# Patient Record
Sex: Female | Born: 1995 | Race: Black or African American | Hispanic: No | Marital: Single | Smoking: Never smoker
Health system: Southern US, Community
[De-identification: ages and names within clinical notes are randomized; demographics above are authoritative.]

---

## 2015-10-04 ENCOUNTER — Emergency Department (INDEPENDENT_AMBULATORY_CARE_PROVIDER_SITE_OTHER)
Admission: EM | Admit: 2015-10-04 | Discharge: 2015-10-04 | Disposition: A | Discharge status: Home / Self Care | Payer: Medicaid Other | Source: Home / Self Care | Attending: Family Medicine | Admitting: Family Medicine

## 2015-10-04 ENCOUNTER — Encounter (HOSPITAL_COMMUNITY): Payer: Self-pay | Admitting: Emergency Medicine

## 2015-10-04 ENCOUNTER — Emergency Department (INDEPENDENT_AMBULATORY_CARE_PROVIDER_SITE_OTHER): Payer: Medicaid Other

## 2015-10-04 DIAGNOSIS — M25562 Pain in left knee: Secondary | ICD-10-CM

## 2015-10-04 MED ORDER — IBUPROFEN 800 MG PO TABS
ORAL_TABLET | ORAL | Status: AC
  Filled 2015-10-04: qty 1

## 2015-10-04 MED ORDER — NAPROXEN 500 MG PO TABS: 500.0000 mg | ORAL_TABLET | Freq: Two times a day (BID) | ORAL | Status: DC

## 2015-10-04 MED ORDER — IBUPROFEN 800 MG PO TABS
800.0000 mg | ORAL_TABLET | Freq: Once | ORAL | Status: AC
  Administered 2015-10-04: 800 mg via ORAL
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
## 2015-10-04 NOTE — Discharge Instructions (Signed)
It is a pleasure to see you today for the pain in your left knee.   The x-ray in the Urgent Care Center does not show fractures or other bony injuries.   You are being given a dose of IBUPROFEN 800mg  to take by mouth in the Urgent Care Center.   Starting tomorrow, you may use the prescription NAPROXEN 500mg , take 1 tablet by mouth with breakfast and with supper, if you have knee pain.   Ice to the left knee frequently through the day; a note for work is being given.

## 2015-12-03 ENCOUNTER — Encounter (HOSPITAL_COMMUNITY): Payer: Self-pay

## 2015-12-03 ENCOUNTER — Emergency Department (HOSPITAL_COMMUNITY)
Admission: EM | Admit: 2015-12-03 | Discharge: 2015-12-03 | Disposition: A | Discharge status: Home / Self Care | Payer: Medicaid Other | Attending: Emergency Medicine | Admitting: Emergency Medicine

## 2015-12-03 DIAGNOSIS — Z791 Long term (current) use of non-steroidal anti-inflammatories (NSAID): Secondary | ICD-10-CM | POA: Diagnosis not present

## 2015-12-03 DIAGNOSIS — H9203 Otalgia, bilateral: Secondary | ICD-10-CM | POA: Diagnosis present

## 2015-12-03 DIAGNOSIS — H6693 Otitis media, unspecified, bilateral: Secondary | ICD-10-CM | POA: Diagnosis not present

## 2015-12-03 DIAGNOSIS — Z792 Long term (current) use of antibiotics: Secondary | ICD-10-CM | POA: Insufficient documentation

## 2015-12-03 MED ORDER — AMOXICILLIN 500 MG PO CAPS: 500.0000 mg | ORAL_CAPSULE | Freq: Two times a day (BID) | ORAL | Status: DC

## 2015-12-03 MED ORDER — OFLOXACIN 0.3 % OT SOLN: 5.0000 [drp] | Freq: Two times a day (BID) | OTIC | Status: DC

## 2015-12-03 MED ORDER — IBUPROFEN 400 MG PO TABS
600.0000 mg | ORAL_TABLET | Freq: Once | ORAL | Status: AC
  Administered 2015-12-03: 600 mg via ORAL
  Filled 2015-12-03: qty 1

## 2015-12-03 MED ORDER — AMOXICILLIN 500 MG PO CAPS
500.0000 mg | ORAL_CAPSULE | Freq: Once | ORAL | Status: AC
  Administered 2015-12-03: 500 mg via ORAL
  Filled 2015-12-03: qty 1
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
## 2015-12-03 NOTE — ED Provider Notes (Signed)
CSN: [id number]     Arrival date & time 12/03/15  1119 History   First MD Initiated Contact with Patient 12/03/15 1224     Chief Complaint  Patient presents with  . Otalgia     (Consider location/radiation/quality/duration/timing/severity/associated sxs/prior Treatment) HPI   Kirsten Mack is a 20 year old female with no significant past medical history presents the ED today complaining of bilateral ear pain onset 3 days ago. Patient states that it initially started her right ear but now is hurting in her left ear as well. She states she feels like her ears are swollen. She has not tried taking anything for her symptoms. She denies fevers, chills, mastoid tenderness, neck pain or stiffness, sore throat, cough, rhinorrhea.   History reviewed. No pertinent past medical history. History reviewed. No pertinent past surgical history. No family history on file. Social History  Substance Use Topics  . Smoking status: Never Smoker   . Smokeless tobacco: None  . Alcohol Use: No   OB History    No data available     Review of Systems  All other systems reviewed and are negative.     Allergies  Review of patient's allergies indicates no known allergies.  Home Medications   Prior to Admission medications   Medication Sig Start Date End Date Taking? Authorizing Provider  amoxicillin (AMOXIL) 500 MG capsule Take 1 capsule (500 mg total) by mouth 2 (two) times daily. 12/03/15   Passion Lavin Tripp Yarielys Beed, PA-C  naproxen (NAPROSYN) 500 MG tablet Take 1 tablet (500 mg total) by mouth 2 (two) times daily with a meal. 10/04/15   Barbaraann Barthel, MD  ofloxacin (FLOXIN) 0.3 % otic solution Place 5 drops into both ears 2 (two) times daily. 12/03/15   Kassadee Carawan Tripp Areesha Dehaven, PA-C   BP 126/85 mmHg  Pulse 65  Temp(Src) 97.9 F (36.6 C) (Oral)  Resp 14  SpO2 100% Physical Exam  Constitutional: She is oriented to person, place, and time. She appears well-developed and well-nourished. No distress.   HENT:  Head: Normocephalic and atraumatic.  Right Ear: There is swelling. No mastoid tenderness. Tympanic membrane is erythematous.  Left Ear: There is swelling. No mastoid tenderness. Tympanic membrane is erythematous.  Eyes: Conjunctivae and EOM are normal. Pupils are equal, round, and reactive to light. Right eye exhibits no discharge. Left eye exhibits no discharge. No scleral icterus.  Cardiovascular: Normal rate.   Pulmonary/Chest: Effort normal.  Neurological: She is alert and oriented to person, place, and time. Coordination normal.  Skin: Skin is warm and dry. No rash noted. She is not diaphoretic. No erythema. No pallor.  Psychiatric: She has a normal mood and affect. Her behavior is normal.  Nursing note and vitals reviewed.   ED Course  Procedures (including critical care time) Labs Review Labs Reviewed - No data to display  Imaging Review No results found. I have personally reviewed and evaluated these images and lab results as part of my medical decision-making.   EKG Interpretation None      MDM   Final diagnoses:  Bilateral otitis media, recurrence not specified, unspecified chronicity, unspecified otitis media type    Patient presents with otalgia and exam consistent with acute otitis media. No concern for acute mastoiditis, meningitis. Patient also has swelling of both external ear canals, will also treat with antibiotic drops for possible otitis externa as well.  No antibiotic use in the last month.  Patient discharged home with Amoxicillin.   Advised PCP follow-up.  I have also  discussed reasons to return immediately to the ER.  Parent expresses understanding and agrees with plan.       Lester KinsmanSamantha Tripp [place], PA-C 12/03/15 1300  Glynn OctaveStephen Rancour, MD 12/03/15 [phone]

## 2015-12-03 NOTE — ED Notes (Signed)
Patient here with bilateral ear pain x 3 days. No other associated sympoms

## 2015-12-03 NOTE — ED Notes (Signed)
Declined W/C at D/C and was escorted to lobby by RN. 

## 2017-02-08 ENCOUNTER — Ambulatory Visit (HOSPITAL_COMMUNITY)
Admission: EM | Admit: 2017-02-08 | Discharge: 2017-02-08 | Disposition: A | Discharge status: Home / Self Care | Payer: Medicaid Other | Attending: Emergency Medicine | Admitting: Emergency Medicine

## 2017-02-08 ENCOUNTER — Encounter (HOSPITAL_COMMUNITY): Payer: Self-pay | Admitting: *Deleted

## 2017-02-08 DIAGNOSIS — N3090 Cystitis, unspecified without hematuria: Secondary | ICD-10-CM

## 2017-02-08 DIAGNOSIS — Z3202 Encounter for pregnancy test, result negative: Secondary | ICD-10-CM

## 2017-02-08 DIAGNOSIS — N898 Other specified noninflammatory disorders of vagina: Secondary | ICD-10-CM | POA: Diagnosis not present

## 2017-02-08 DIAGNOSIS — R109 Unspecified abdominal pain: Secondary | ICD-10-CM | POA: Diagnosis present

## 2017-02-08 LAB — POCT PREGNANCY, URINE: Preg Test, Ur: NEGATIVE

## 2017-02-08 MED ORDER — NITROFURANTOIN MONOHYD MACRO 100 MG PO CAPS: 100.0000 mg | ORAL_CAPSULE | Freq: Two times a day (BID) | ORAL | 0 refills | Status: DC

## 2017-02-08 MED ORDER — PHENAZOPYRIDINE HCL 200 MG PO TABS: 200.0000 mg | ORAL_TABLET | Freq: Three times a day (TID) | ORAL | 0 refills | Status: AC

## 2017-02-08 MED ORDER — METRONIDAZOLE 0.75 % VA GEL: 1.0000 | Freq: Two times a day (BID) | VAGINAL | 0 refills | Status: AC

## 2017-02-08 NOTE — ED Provider Notes (Signed)
CSN: [id number]     Arrival date & time 02/08/17  1150 History   None    Chief Complaint  Patient presents with  . Abdominal Pain   (Consider location/radiation/quality/duration/timing/severity/associated sxs/prior Treatment) 21 year old female comes in for 2 day history of painful urination. History of present illness was given by patient through translator. She also has vaginal discharge. Patient denies abdominal pain, nausea, vomiting. Denies fever, chills, night sweats. Denies frequency, hematuria, back pain. Patient states she has vaginal discharge with small bumps around her labia majora. She is currently not sexually active, last menstrual cycle 01/22/2017, without problems. She does shave, small bumps are not painful to palpation. Denies surrounding erythema, increased warmth.      History reviewed. No pertinent past medical history. History reviewed. No pertinent surgical history. History reviewed. No pertinent family history. Social History  Substance Use Topics  . Smoking status: Never Smoker  . Smokeless tobacco: Never Used  . Alcohol use No   OB History    No data available     Review of Systems  Reason unable to perform ROS: See HPI as above.    Allergies  Patient has no known allergies.  Home Medications   Prior to Admission medications   Medication Sig Start Date End Date Taking? Authorizing Provider  metroNIDAZOLE (METROGEL) 0.75 % vaginal gel Place 1 Applicatorful vaginally 2 (two) times daily. 02/08/17 02/13/17  Belinda FisherYu, Amy V, PA-C  nitrofurantoin, macrocrystal-monohydrate, (MACROBID) 100 MG capsule Take 1 capsule (100 mg total) by mouth 2 (two) times daily. 02/08/17   Cathie HoopsYu, Amy V, PA-C  phenazopyridine (PYRIDIUM) 200 MG tablet Take 1 tablet (200 mg total) by mouth 3 (three) times daily. 02/08/17   Belinda FisherYu, Amy V, PA-C   Meds Ordered and Administered this Visit  Medications - No data to display  BP 135/77 (BP Location: Right Arm)   Pulse 74   Temp 98.5 F (36.9 C)  (Oral)   Resp 16   LMP 01/22/2017   SpO2 100%  No data found.   Physical Exam  Constitutional: She is oriented to person, place, and time. She appears well-developed and well-nourished. No distress.  HENT:  Head: Normocephalic and atraumatic.  Eyes: Pupils are equal, round, and reactive to light. Conjunctivae are normal.  Cardiovascular: Normal rate, regular rhythm and normal heart sounds.  Exam reveals no gallop and no friction rub.   No murmur heard. Pulmonary/Chest: Effort normal and breath sounds normal. She has no wheezes. She has no rales.  Abdominal: Soft. Bowel sounds are normal. She exhibits no mass. There is no tenderness. There is no rebound and no guarding.  Genitourinary: Uterus normal. Cervix exhibits discharge. Cervix exhibits no motion tenderness and no friability. Right adnexum displays no mass and no tenderness. Left adnexum displays no mass and no tenderness. No erythema in the vagina. Vaginal discharge found.  Genitourinary Comments: Multiple round papular lesions around bilateral labia majora. Not painful to palpation, no increased warmth, surrounding erythema.   Neurological: She is alert and oriented to person, place, and time.  Skin: Skin is warm and dry.  Psychiatric: She has a normal mood and affect. Her behavior is normal. Judgment normal.    Urgent Care Course   Clinical Course as of Feb 08 1406  Fri Feb 08, 2017  1403 Results of dipstick read to me, large leukocytes, positive nitrites, large Hgb.   [AY]    Clinical Course User Index [AY] Belinda FisherYu, Amy V, PA-C    Procedures (including critical care time)  Labs Review Labs Reviewed  URINE CULTURE  POCT PREGNANCY, URINE  CERVICOVAGINAL ANCILLARY ONLY    Imaging Review No results found.         MDM   1. Cystitis   2. Vaginal discharge    Discussed lab results with patient, dipstick positive for UTI, will start Macrobid. Given patient with extreme pain on urination, pyridium sent in. Vaginal  discharge most consistent with bacterial vaginosis, will start empiric treatment with metrogel given patient is on other medications that could cause interactions. Urine culture and cytology sent, patient will be contacted with any positive results that requires further treatment. Monitor for worsening of symptoms, abdominal pain, fever, follow-up for reevaluation.   Belinda FisherYu, Amy V, PA-C 02/08/17 1407

## 2017-02-08 NOTE — Discharge Instructions (Signed)
Your urine was positive for UTI, take Macrobid as directed for 5 days. Pyridium for painful urination as directed. We are treating you for bacterial vaginosis, use MetroGel as directed vaginally. Testing is sent, you will be contacted for any additional treatment needed. Monitor for worsening of symptoms, abdominal pain, fever, follow-up at the emergency department for further evaluation.

## 2017-02-08 NOTE — ED Triage Notes (Addendum)
Pt  Reports    Low   abd   Pain       With     painfull      Urination    And  Some  Bleeding  When  She  Urinates       Symptoms   X   2          Days  [employer]  Utilized  Also  Has  A  Growth  Or  A   Wart  X  2  Weeks  In  Vaginal  Area

## 2017-02-08 NOTE — ED Notes (Signed)
Urine specimen obtained, urinalysis run, values of the urine exceeded the limits of the clinitex test. Reported results to PA Amy Yu.

## 2017-02-10 ENCOUNTER — Telehealth (HOSPITAL_COMMUNITY): Payer: Self-pay | Admitting: Internal Medicine

## 2017-02-10 MED ORDER — CEPHALEXIN 500 MG PO CAPS: 500.0000 mg | ORAL_CAPSULE | Freq: Two times a day (BID) | ORAL | 0 refills | Status: AC

## 2017-02-10 NOTE — Telephone Encounter (Signed)
Clinical staff, please let patient know that urine culture was positive for Proteus germ, resistant to nitrofurantoin rx given at the urgent care visit 8/3 but sensitive to cephalexin.  Rx cephal[facility] sent [facility] pharmacy of [facility] [place].  Stop nitrofurantoin and start cephalexin.   Other test results still pending.  Recheck for further evaluation if symptoms are not improving.  LM

## 2017-02-11 LAB — CERVICOVAGINAL ANCILLARY ONLY
BACTERIAL VAGINITIS: POSITIVE — AB
Candida vaginitis: POSITIVE — AB
Chlamydia: POSITIVE — AB
NEISSERIA GONORRHEA: NEGATIVE
TRICH (WINDOWPATH): NEGATIVE

## 2017-02-12 ENCOUNTER — Telehealth (HOSPITAL_COMMUNITY): Payer: Self-pay | Admitting: *Deleted

## 2017-02-12 MED ORDER — FLUCONAZOLE 150 MG PO TABS: 150.0000 mg | ORAL_TABLET | Freq: Once | ORAL | 0 refills | Status: AC

## 2017-02-12 MED ORDER — AZITHROMYCIN 250 MG PO TABS: 1000.0000 mg | ORAL_TABLET | Freq: Once | ORAL | 0 refills | Status: AC

## 2017-02-12 NOTE — Telephone Encounter (Signed)
[facility] Called and spoke to patient, patients second language is english. Medications sent to pharmacy.

## 2017-02-13 LAB — URINE CULTURE: Culture: >=100000 — AB

## 2017-03-15 ENCOUNTER — Telehealth: Payer: Self-pay

## 2017-03-15 NOTE — Telephone Encounter (Signed)
Client  stopped by requesting assistance calling [place] [facility]. Same done. Patient has no health concerns at this time  Arman BogusDorothy Ameka Krigbaum RN  BSN PCCN CNP [phone]

## 2017-04-12 NOTE — Congregational Nurse Program (Signed)
Congregational Nurse Program Note  Date of Encounter: 04/12/2017  Past Medical History: No past medical history on file.  Encounter Details:     CNP Questionnaire - 04/12/17 1300      Patient Demographics   Is this a new or existing patient? Existing   Patient is considered a/an Refugee   Race African     Patient Assistance   Location of Patient Engineer, [place]   Patient's financial/insurance status Self-Pay (Uninsured)   Uninsured Patient ([employer] officer, trade union) Yes   Interventions Not Applicable   Patient referred to apply for the following financial assistance [employer] insecurities addressed Not Technical brewer No   Assistance securing medications No   Product/process development scientist the healthcare system     Encounter Details   Primary purpose of visit Other  assisted her to read a letter from pharmacy   Was an Emergency Department visit averted? Not Applicable   Does patient have a medical provider? Yes   Patient referred to Not Applicable   Was a mental health screening completed? (GAINS tool) No   Does patient have dental issues? No   Does patient have vision issues? No   Does your patient have an abnormal blood pressure today? No   Since previous encounter, have you referred patient for abnormal blood pressure that resulted in a new diagnosis or medication change? No   Does your patient have an abnormal blood glucose today? No   Since previous encounter, have you referred patient for abnormal blood glucose that resulted in a new diagnosis or medication change? No   Was there a life-saving intervention made? No    Client stopped by the office requesting help to read and understanding a letter from [facility] pharmacy. Same done. No other concerns. Nicole Cella Marna Weniger RN BSN PCCN CNP [phone]  [phone]

## 2017-08-05 NOTE — Congregational Nurse Program (Signed)
Congregational Nurse Program Note  Date of Encounter: 08/05/2017  Past Medical History: No past medical history on file.  Encounter Details: CNP Questionnaire - 08/05/17 1250      Questionnaire   Patient Status  Refugee    Race  African    Location Patient Served At  Not Applicable    Insurance  Not Applicable    Uninsured  Uninsured (NEW 1x/quarter)    Food  No food insecurities    Housing/Utilities  Yes, have permanent housing    Transportation  No transportation needs    Interpersonal Safety  Yes, feel physically and emotionally safe where you currently live    Medication  Yes, have medication insecurities    Medical Provider  No    Referrals  Medicaid    ED Visit Averted  Not Applicable    Life-Saving Intervention Made  Not Applicable      Kirsten Mack came to the. center with concerns that she may be a few weeks pregnant. She reports her LMP to be 06/08/17. She has not done home pregnancy test yet.She will be back in 2 days after a home pregnancy test.We will then make appointment with OBGYN. [name] RN BSN PCCN CNP [phone]

## 2017-08-17 ENCOUNTER — Emergency Department (HOSPITAL_COMMUNITY)
Admission: EM | Admit: 2017-08-17 | Discharge: 2017-08-17 | Disposition: A | Discharge status: Home / Self Care | Payer: Self-pay | Attending: Emergency Medicine | Admitting: Emergency Medicine

## 2017-08-17 ENCOUNTER — Emergency Department (HOSPITAL_COMMUNITY): Payer: Self-pay

## 2017-08-17 ENCOUNTER — Encounter (HOSPITAL_COMMUNITY): Payer: Self-pay

## 2017-08-17 DIAGNOSIS — O26891 Other specified pregnancy related conditions, first trimester: Secondary | ICD-10-CM

## 2017-08-17 DIAGNOSIS — Z3A09 9 weeks gestation of pregnancy: Secondary | ICD-10-CM | POA: Insufficient documentation

## 2017-08-17 DIAGNOSIS — R109 Unspecified abdominal pain: Secondary | ICD-10-CM | POA: Insufficient documentation

## 2017-08-17 DIAGNOSIS — O9989 Other specified diseases and conditions complicating pregnancy, childbirth and the puerperium: Secondary | ICD-10-CM | POA: Insufficient documentation

## 2017-08-17 LAB — URINALYSIS, ROUTINE W REFLEX MICROSCOPIC
BILIRUBIN URINE: NEGATIVE
Glucose, UA: NEGATIVE mg/dL
Hgb urine dipstick: NEGATIVE
Ketones, ur: NEGATIVE mg/dL
Nitrite: NEGATIVE
Protein, ur: NEGATIVE mg/dL
Specific Gravity, Urine: 1.023 (ref 1.005–1.030)
pH: 6 (ref 5.0–8.0)

## 2017-08-17 LAB — CBC
HCT: 34.3 % — ABNORMAL LOW (ref 36.0–46.0)
Hemoglobin: 11.5 g/dL — ABNORMAL LOW (ref 12.0–15.0)
MCH: 25.4 pg — AB (ref 26.0–34.0)
MCHC: 33.5 g/dL (ref 30.0–36.0)
MCV: 75.7 fL — AB (ref 78.0–100.0)
Platelets: 204 10*3/uL (ref 150–400)
RBC: 4.53 MIL/uL (ref 3.87–5.11)
RDW: 15.5 % (ref 11.5–15.5)
WBC: 6.8 10*3/uL (ref 4.0–10.5)

## 2017-08-17 LAB — COMPREHENSIVE METABOLIC PANEL
ALBUMIN: 3.9 g/dL (ref 3.5–5.0)
ALT: 10 U/L — AB (ref 14–54)
AST: 18 U/L (ref 15–41)
Alkaline Phosphatase: 43 U/L (ref 38–126)
Anion gap: 10 (ref 5–15)
BUN: 5 mg/dL — AB (ref 6–20)
CHLORIDE: 104 mmol/L (ref 101–111)
CO2: 20 mmol/L — AB (ref 22–32)
Calcium: 9.4 mg/dL (ref 8.9–10.3)
Creatinine, Ser: 0.53 mg/dL (ref 0.44–1.00)
GFR calc Af Amer: >60 mL/min (ref >60)
Glucose, Bld: 82 mg/dL (ref 65–99)
POTASSIUM: 3.9 mmol/L (ref 3.5–5.1)
SODIUM: 134 mmol/L — AB (ref 135–145)
Total Bilirubin: 0.7 mg/dL (ref 0.3–1.2)
Total Protein: 8.1 g/dL (ref 6.5–8.1)

## 2017-08-17 LAB — I-STAT BETA HCG BLOOD, ED (MC, WL, AP ONLY): I-stat hCG, quantitative: >2000 m[IU]/mL — ABNORMAL HIGH (ref <5)

## 2017-08-17 LAB — WET PREP, GENITAL
Clue Cells Wet Prep HPF POC: NONE SEEN
Sperm: NONE SEEN
Trich, Wet Prep: NONE SEEN
Yeast Wet Prep HPF POC: NONE SEEN

## 2017-08-17 LAB — LIPASE, BLOOD: LIPASE: 25 U/L (ref 11–51)

## 2017-08-17 NOTE — Discharge Instructions (Signed)
Return to the ED with any concerns including bleeding and soaking more than one pad per hour, vomiting and not able to keep down fluids, decreased level of alertness/lethargy, or any other alarming symptoms

## 2017-08-17 NOTE — ED Provider Notes (Signed)
[facility] EMERGENCY DEPARTMENT Provider Note   CSN: [id number] Arrival date & time: 08/17/17  1215     History   Chief Complaint Chief Complaint  Patient presents with  . Abdominal Pain    HPI Kirsten Mack is a 22 y.o. female.  HPI  A LEVEL 5 CAVEAT PERTAINS DUE TO LANGUAGE BARRIER- interpreter was used for swahili.  Patient presents with complaint of abdominal pain she states the pain is under her bellybutton.  She denies any vomiting or fevers.  No change in stools.  She states her last menstrual period was January 12.  She does not know that she is pregnant.  She is G2P1.  History reviewed. No pertinent past medical history.  There are no active problems to display for this patient.   History reviewed. No pertinent surgical history.  OB History    No data available       Home Medications    Prior to Admission medications   Medication Sig Start Date End Date Taking? Authorizing Provider  phenazopyridine (PYRIDIUM) 200 MG tablet Take 1 tablet (200 mg total) by mouth 3 (three) times daily. 02/08/17   Belinda Fisher, PA-C    Family History History reviewed. No pertinent family history.  Social History Social History   Tobacco Use  . Smoking status: Never Smoker  . Smokeless tobacco: Never Used  Substance Use Topics  . Alcohol use: No  . Drug use: No     Allergies   Patient has no known allergies.   Review of Systems Review of Systems  ROS reviewed and all otherwise negative except for mentioned in HPI   Physical Exam Updated Vital Signs BP 128/80 (BP Location: Right Arm)   Pulse 83   Temp (!) 97.4 F (36.3 C) (Oral)   Resp 16   SpO2 100%  Vitals reviewed Physical Exam  Physical Examination: General appearance - alert, well appearing, and in no distress Mental status - alert, oriented to person, place, and time Eyes -no conjunctival injection  No scleral icterus Mouth - mucous membranes moist, pharynx normal without lesions Neck  - supple, no significant adenopathy Chest - clear to auscultation, no wheezes, rales or rhonchi, symmetric air entry Heart - normal rate, regular rhythm, normal S1, S2, no murmurs, rubs, clicks or gallops Abdomen - soft, nontender, nondistended, no masses or organomegaly Pelvic - normal external genitalia, vulva, vagina, cervix, uterus and adnexa, no CMT, no adnexal tenderness Neurological - alert, oriented x 3 Extremities - peripheral pulses normal, no pedal edema, no clubbing or cyanosis Skin - normal coloration and turgor, no rashes   ED Treatments / Results  Labs (all labs ordered are listed, but only abnormal results are displayed) Labs Reviewed  WET PREP, GENITAL - Abnormal; Notable for the following components:      Result Value   WBC, Wet Prep HPF POC FEW (*)    All other components within normal limits  COMPREHENSIVE METABOLIC PANEL - Abnormal; Notable for the following components:   Sodium 134 (*)    CO2 20 (*)    BUN 5 (*)    ALT 10 (*)    All other components within normal limits  CBC - Abnormal; Notable for the following components:   Hemoglobin 11.5 (*)    HCT 34.3 (*)    MCV 75.7 (*)    MCH 25.4 (*)    All other components within normal limits  URINALYSIS, ROUTINE W REFLEX MICROSCOPIC - Abnormal; Notable for the following  components:   APPearance HAZY (*)    Leukocytes, UA MODERATE (*)    Bacteria, UA RARE (*)    Squamous Epithelial / LPF 6-30 (*)    All other components within normal limits  I-STAT BETA HCG BLOOD, ED ([facility], [facility], [facility] ONLY) - Abnormal; Notable for the following components:   I-stat hCG, quantitative >2,000.0 (*)    All other components within normal limits  LIPASE, BLOOD  GC/CHLAMYDIA PROBE AMP ([place]) NOT AT [facility]    EKG  EKG Interpretation None       Radiology US Ob Comp Less 14 Wks  Result Date: 08/17/2017 CLINICAL DATA:  Pelvic pain EXAM: OBSTETRIC <14 WK [place] AND TRANSVAGINAL OB US TECHNIQUE: Both transabdominal and transvaginal  ultrasound examinations were performed for complete evaluation of the gestation as well as the maternal uterus, adnexal regions, and pelvic cul-de-sac. Transvaginal technique was performed to assess early pregnancy. COMPARISON:  None. FINDINGS: Intrauterine gestational sac: Visualized Yolk sac:  Visualized Embryo:  Visualized Cardiac Activity: Visualized Heart Rate: 141 bpm CRL:  23 mm   9 w   0 d                  [place] EDC: March 22, 2018 Subchorionic hemorrhage:  None visualized. Maternal uterus/adnexae: Cervical os is closed. Ovaries appear normal in size and contour bilaterally. No extrauterine pelvic mass. No free pelvic fluid. IMPRESSION: Single live intrauterine gestation with estimated gestational age of [redacted] weeks. No subchorionic hemorrhage evident. Study otherwise unremarkable. Electronically Signed   By: Bretta Bang III M.D.   On: 08/17/2017 17:14   US Ob Transvaginal  Result Date: 08/17/2017 CLINICAL DATA:  Pelvic pain EXAM: OBSTETRIC <14 WK [place] AND TRANSVAGINAL OB US TECHNIQUE: Both transabdominal and transvaginal ultrasound examinations were performed for complete evaluation of the gestation as well as the maternal uterus, adnexal regions, and pelvic cul-de-sac. Transvaginal technique was performed to assess early pregnancy. COMPARISON:  None. FINDINGS: Intrauterine gestational sac: Visualized Yolk sac:  Visualized Embryo:  Visualized Cardiac Activity: Visualized Heart Rate: 141 bpm CRL:  23 mm   9 w   0 d                  [place] EDC: March 22, 2018 Subchorionic hemorrhage:  None visualized. Maternal uterus/adnexae: Cervical os is closed. Ovaries appear normal in size and contour bilaterally. No extrauterine pelvic mass. No free pelvic fluid. IMPRESSION: Single live intrauterine gestation with estimated gestational age of [redacted] weeks. No subchorionic hemorrhage evident. Study otherwise unremarkable. Electronically Signed   By: Bretta Bang III M.D.   On: 08/17/2017 17:14     Procedures Procedures (including critical care time)  Medications Ordered in ED Medications - No data to display   Initial Impression / Assessment and Plan / ED Course  I have reviewed the triage vital signs and the nursing notes.  Pertinent labs & imaging results that were available during my care of the patient were reviewed by me and considered in my medical decision making (see chart for details).     Patient presents with complaint of abdominal pain.  Her pregnancy test is positive pelvic ultrasound shows a 9-week gestation with fetal heart tones present.  Her pelvic exam is unremarkable with closed cervical loss.  Patient given information for follow-up with OB/GYN.  No evidence of ectopic pregnancy, infectious process, miscarriage.  Patient is overall nontoxic and well-appearing.  Discharged with strict return precautions.  Pt agreeable with plan.  Final Clinical Impressions(s) /  ED Diagnoses   Final diagnoses:  Abdominal pain during pregnancy in first trimester    ED Discharge Orders    None       Mabe, Latanya MaudlinMartha L, MD 08/17/17 [phone]

## 2017-08-17 NOTE — ED Triage Notes (Signed)
Pt presents with 1 week h/o abdominal pain.  Pt points to umbilicus as to location of pain.  +nausea and vomiting;  Pt unsure of when her last bowel movement was.  Pt denies any dysuria

## 2017-08-17 NOTE — ED Notes (Signed)
Patient is alert and orientedx4.  Patient was explained discharge instructions and they understood them with no questions.  Her Family is taking her home.

## 2017-08-19 LAB — GC/CHLAMYDIA PROBE AMP (~~LOC~~) NOT AT ARMC
Chlamydia: NEGATIVE
NEISSERIA GONORRHEA: NEGATIVE

## 2018-04-02 ENCOUNTER — Ambulatory Visit: Payer: Medicaid Other | Admitting: Family Medicine

## 2019-12-23 IMAGING — US US OB TRANSVAGINAL
1 series · 14 of 28 positions shown · non-contrast
Comparison: None.

CLINICAL DATA: Pelvic pain

EXAM:
OBSTETRIC <14 WK US AND TRANSVAGINAL OB US
TECHNIQUE: Both transabdominal and transvaginal ultrasound examinations were
performed for complete evaluation of the gestation as well as the
maternal uterus, adnexal regions, and pelvic cul-de-sac.
Transvaginal technique was performed to assess early pregnancy.

[Series 1: us ob transvaginal · 0.14mm/px · 63 acquisitions, 14 frames shown]
[im 3/63]
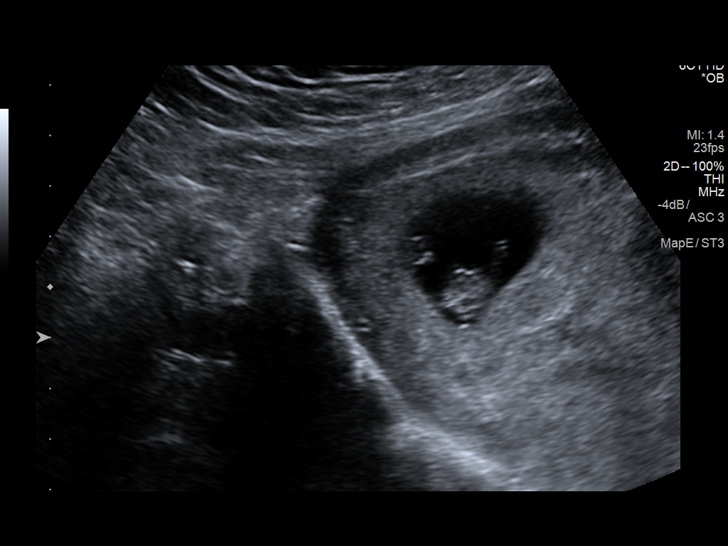
[im 7/63]
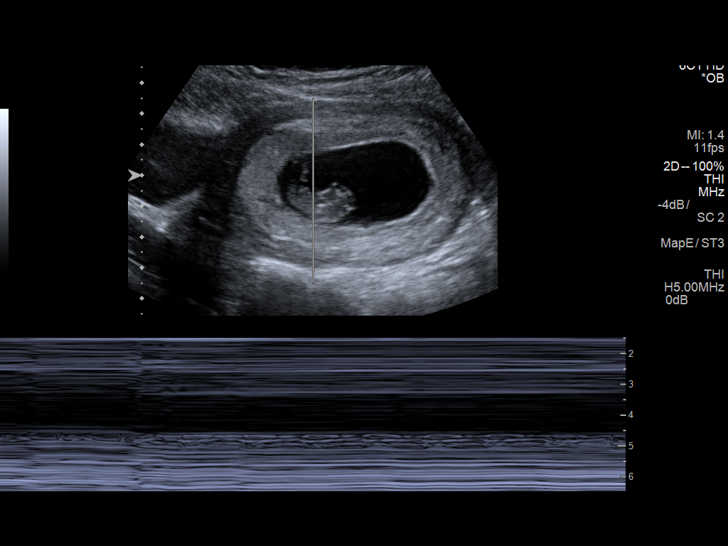
[im 12/63]
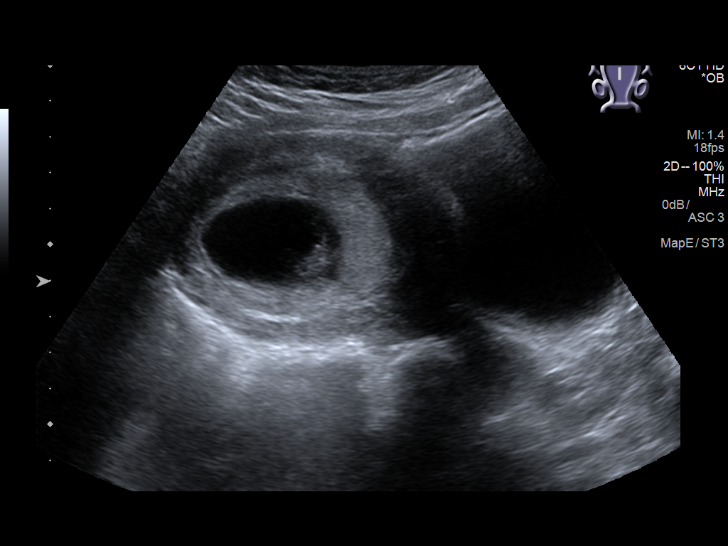
[im 17/63]
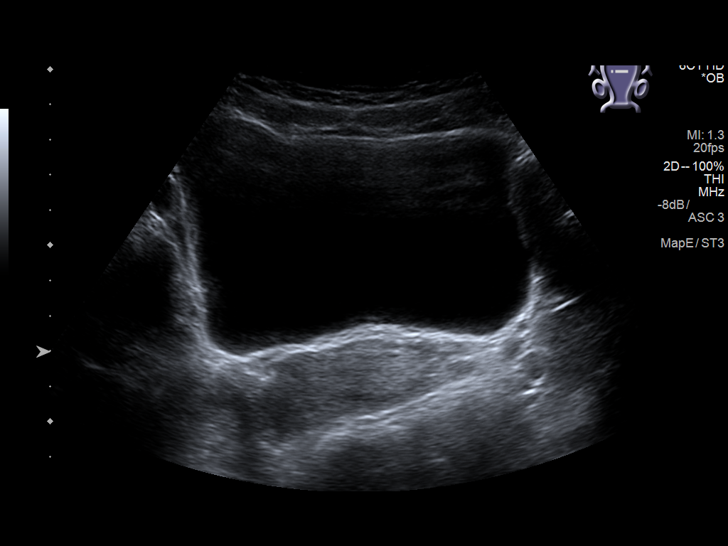
[im 21/63]
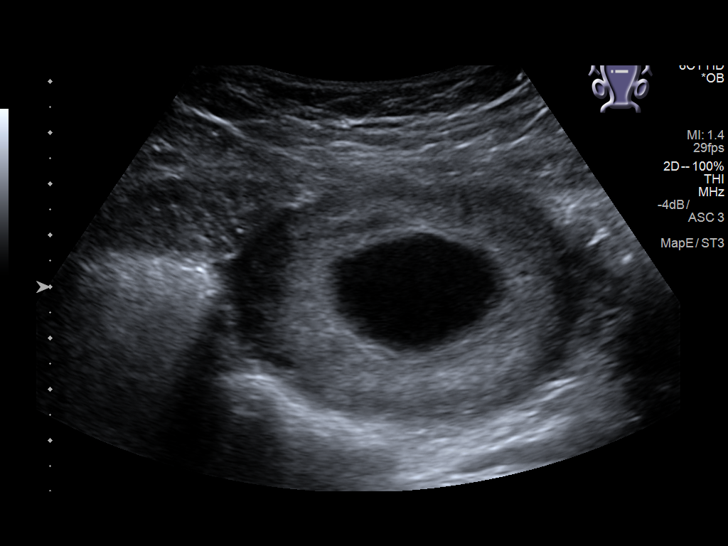
[im 26/63]
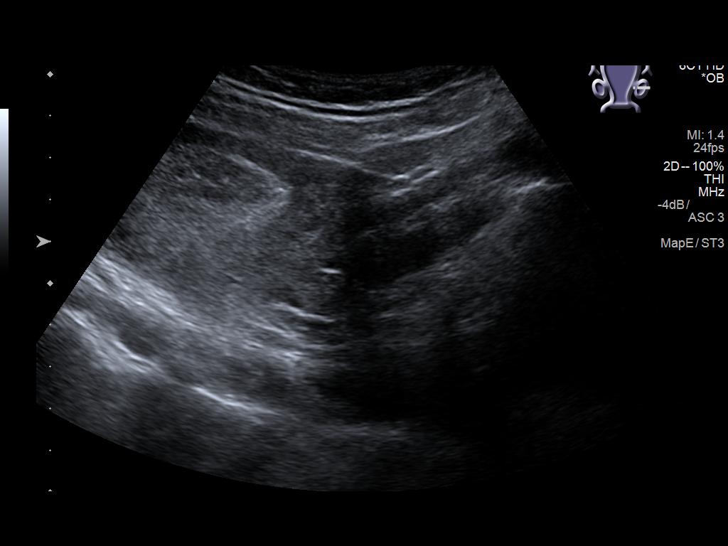
[im 30/63]
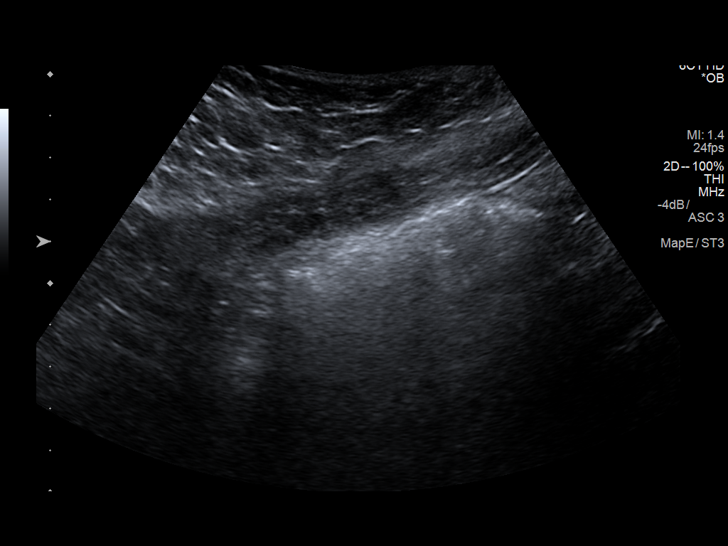
[im 35/63]
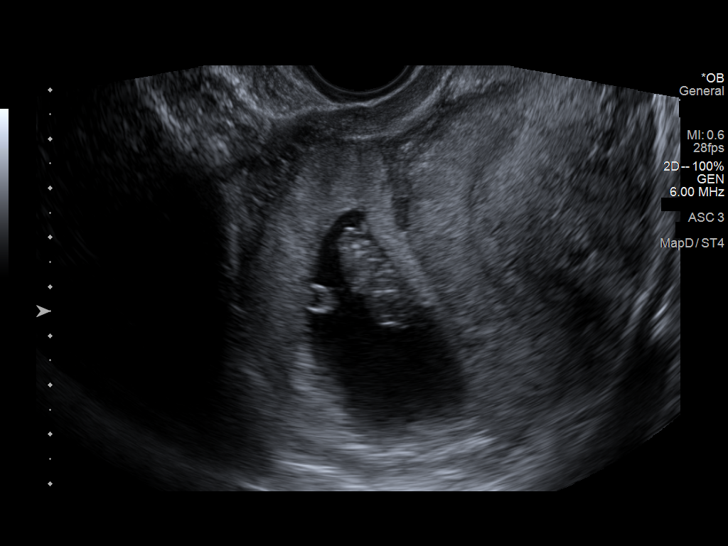
[im 40/63]
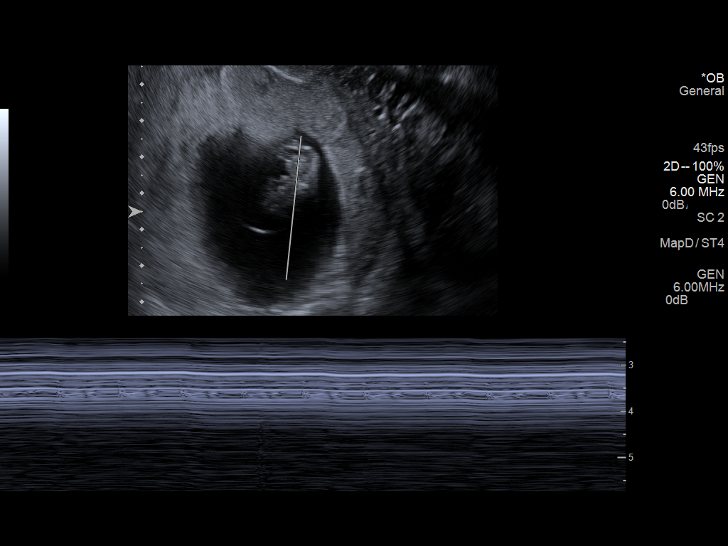
[im 44/63]
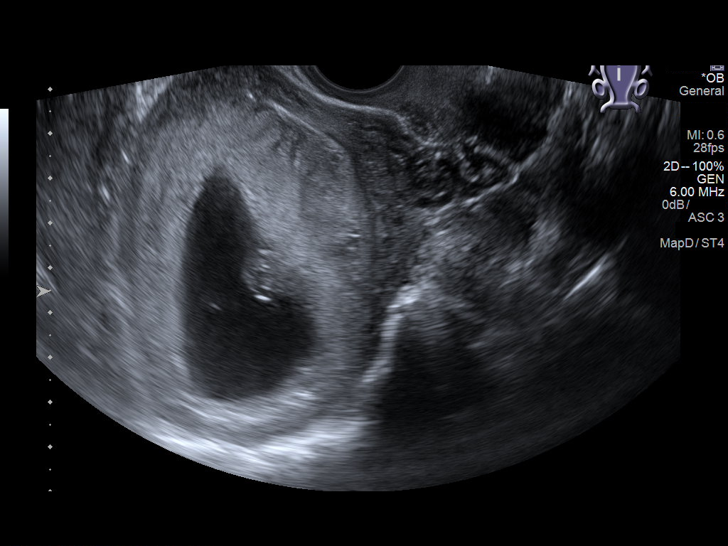
[im 49/63]
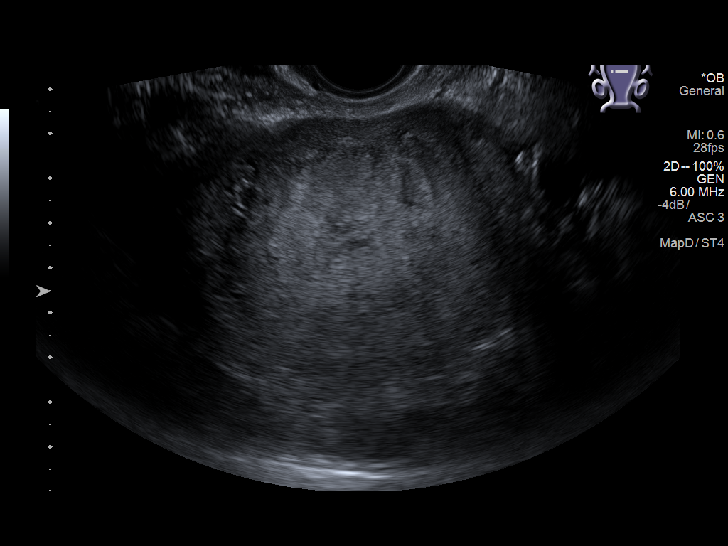
[im 53/63]
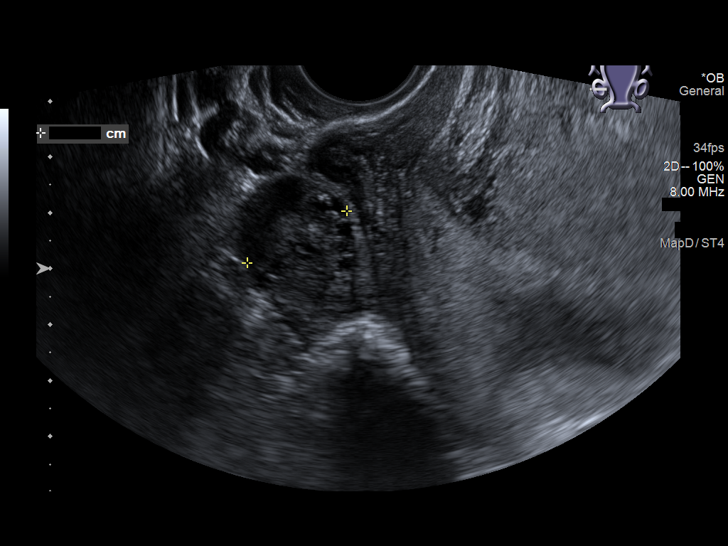
[im 58/63]
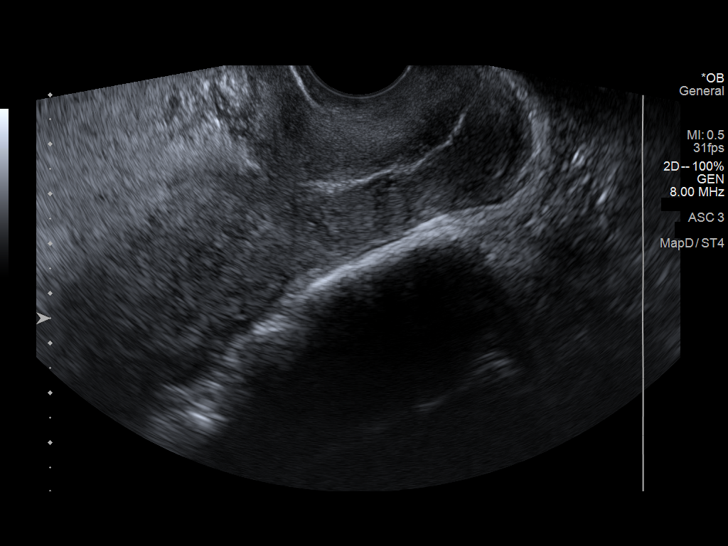
[im 63/63]
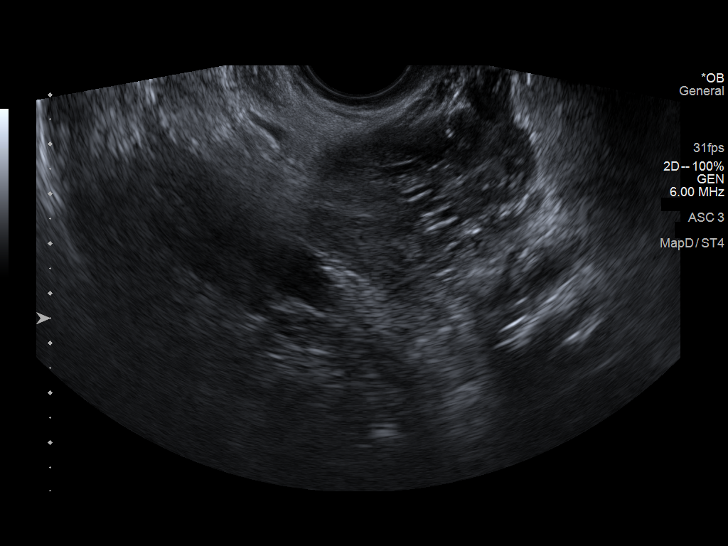

[14 of 28 positions shown; findings below may reference images not displayed]

FINDINGS: Intrauterine gestational sac: Visualized

Yolk sac:  Visualized

Embryo:  Visualized

Cardiac Activity: Visualized

Heart Rate: 141 bpm

CRL:  23 mm   9 w   0 d                  US EDC: March 22, 2018

Subchorionic hemorrhage:  None visualized.

Maternal uterus/adnexae: Cervical os is closed. Ovaries appear
normal in size and contour bilaterally. No extrauterine pelvic mass.
No free pelvic fluid.
IMPRESSION: Single live intrauterine gestation with estimated gestational age of
9 weeks. No subchorionic hemorrhage evident. Study otherwise
unremarkable.

## 2020-11-17 ENCOUNTER — Telehealth: Payer: Self-pay

## 2020-11-17 NOTE — Telephone Encounter (Signed)
Transition Care Management Unsuccessful Follow-up Telephone Call  Date of discharge and from where:  11/16/20 from [facility]  Attempts:  1st Attempt  Reason for unsuccessful TCM follow-up call:  No answer/busy

## 2020-11-18 NOTE — Telephone Encounter (Signed)
Transition Care Management Unsuccessful Follow-up Telephone Call  Date of discharge and from where:  11/16/2020 from [facility]  Attempts:  2nd Attempt  Reason for unsuccessful TCM follow-up call:  No answer/busy

## 2020-11-21 NOTE — Telephone Encounter (Signed)
Transition Care Management Unsuccessful Follow-up Telephone Call  Date of discharge and from where:  11/16/2020 from [facility]  Attempts:  3rd Attempt  Reason for unsuccessful TCM follow-up call:  Unable to reach patient
